# Patient Record
Sex: Female | Born: 1935 | Race: White | Hispanic: No
Health system: Southern US, Community
[De-identification: ages and names within clinical notes are randomized; demographics above are authoritative.]

---

## 2017-07-14 ENCOUNTER — Emergency Department (HOSPITAL_COMMUNITY): Payer: Medicare Other

## 2017-07-14 ENCOUNTER — Emergency Department (HOSPITAL_COMMUNITY)
Admission: EM | Admit: 2017-07-14 | Discharge: 2017-07-14 | Disposition: A | Discharge status: Skilled Nursing Facility | Payer: Medicare Other | Attending: Emergency Medicine | Admitting: Emergency Medicine

## 2017-07-14 DIAGNOSIS — W010XXA Fall on same level from slipping, tripping and stumbling without subsequent striking against object, initial encounter: Secondary | ICD-10-CM | POA: Insufficient documentation

## 2017-07-14 DIAGNOSIS — W19XXXA Unspecified fall, initial encounter: Secondary | ICD-10-CM

## 2017-07-14 DIAGNOSIS — S82002A Unspecified fracture of left patella, initial encounter for closed fracture: Secondary | ICD-10-CM | POA: Diagnosis not present

## 2017-07-14 DIAGNOSIS — S8992XA Unspecified injury of left lower leg, initial encounter: Secondary | ICD-10-CM | POA: Diagnosis present

## 2017-07-14 DIAGNOSIS — Y9389 Activity, other specified: Secondary | ICD-10-CM | POA: Diagnosis not present

## 2017-07-14 DIAGNOSIS — Y999 Unspecified external cause status: Secondary | ICD-10-CM | POA: Diagnosis not present

## 2017-07-14 DIAGNOSIS — Y92128 Other place in nursing home as the place of occurrence of the external cause: Secondary | ICD-10-CM | POA: Insufficient documentation

## 2017-07-14 DIAGNOSIS — S0083XA Contusion of other part of head, initial encounter: Secondary | ICD-10-CM

## 2017-07-14 NOTE — ED Triage Notes (Signed)
Per EMS, pt had an unwitnessed fall at [facility] nursing facility. EMS reports left leg shortening and bruising to the left side of face. Pt complains of left knee pain. Per EMS pt is AO x4. Pt has a hx of a-fib.

## 2017-07-14 NOTE — Discharge Instructions (Signed)
Please read and follow all provided instructions.  Your diagnoses today include:  1. Fall, initial encounter   2. Closed nondisplaced fracture of left patella, unspecified fracture morphology, initial encounter   3. Contusion of face, initial encounter     Tests performed today include: Vital signs. See below for your results today.   Medications prescribed:  Take as prescribed   Home care instructions:  Follow any educational materials contained in this packet. You can put weight on your leg, but you cannot bend your knee.   Follow-up instructions: Please follow-up with your Orthopedic provider for further evaluation of symptoms and treatment when you are back home   Return instructions:  Please return to the Emergency Department if you do not get better, if you get worse, or new symptoms OR  - Fever (temperature greater than 101.17F)  - Bleeding that does not stop with holding pressure to the area    -Severe pain (please note that you may be more sore the day after your accident)  - Chest Pain  - Difficulty breathing  - Severe nausea or vomiting  - Inability to tolerate food and liquids  - Passing out  - Skin becoming red around your wounds  - Change in mental status (confusion or lethargy)  - New numbness or weakness    Please return if you have any other emergent concerns.  Additional Information:  Your vital signs today were: BP 132/74    Pulse (!) 109    Temp 98.4 F (36.9 C)    Resp 17    SpO2 96%  If your blood pressure (BP) was elevated above 135/85 this visit, please have this repeated by your doctor within one month. ---------------

## 2017-07-14 NOTE — ED Notes (Signed)
[facility] called for TRANSPORT.

## 2017-07-14 NOTE — ED Provider Notes (Signed)
[facility]-EMERGENCY DEPT Provider Note   CSN: [id number] Arrival date & time: 07/14/17  1831     History   Chief Complaint Chief Complaint  Patient presents with  . Fall    HPI Brooke Johnson is a 83 y.o. female.  HPI  83 y.o. female presents to the Emergency Department today via EMS from [facility] due to unwitnessed fall. Pt residing at facility temporarily and is from [place]. Here in [place] due to [place]. Pt states that she got up last night and had mechanical fall. Notes losing her balance. Noted head trauma on left side with bruising noted. Pt states she had no LOC. Denies headache. No visual changes. No N/V. No CP/SOB/ABD pain. No weakness. No numbness/tingling. Denies neck pain. EMS concerned due to shortening of left on left side. Pt denies hip pain. Only endorses left knee pain. Mild swelling. Rates 2/10. No meds PTA. PT does not take blood thinners. No other symptoms noted   No past medical history on file.  There are no active problems to display for this patient.   No past surgical history on file.  OB History    No data available       Home Medications    Prior to Admission medications   Not on File    Family History No family history on file.  Social History Social History  Substance Use Topics  . Smoking status: Not on file  . Smokeless tobacco: Not on file  . Alcohol use Not on file     Allergies   Patient has no allergy information on record.   Review of Systems Review of Systems ROS reviewed and all are negative for acute change except as noted in the HPI.  Physical Exam Updated Vital Signs BP (!) 153/130 (BP Location: Right Arm)   Pulse (!) 58   Temp 98.4 F (36.9 C)   Resp 16   SpO2 94%   Physical Exam  Constitutional: Vital signs are normal. She appears well-developed and well-nourished. No distress.  HENT:  Head: Normocephalic. Head is with contusion. Head is without raccoon's eyes and without Battle's  sign.  Right Ear: No hemotympanum.  Left Ear: No hemotympanum.  Nose: Nose normal.  Mouth/Throat: Uvula is midline, oropharynx is clear and moist and mucous membranes are normal.  Contusion noted on left max. TTP along superior orbit. No swelling. EOM intact and unremarkable. Noted previous surgery to left jaw.   Eyes: Pupils are equal, round, and reactive to light. EOM are normal.  Neck: Trachea normal and normal range of motion. Neck supple. No spinous process tenderness and no muscular tenderness present. No tracheal deviation and normal range of motion present.  No pain along C/T/L spine. No palpable or visible deformities.   Cardiovascular: Normal rate, regular rhythm, S1 normal, S2 normal, normal heart sounds, intact distal pulses and normal pulses.   Pulmonary/Chest: Effort normal and breath sounds normal. No respiratory distress. She has no decreased breath sounds. She has no wheezes. She has no rhonchi. She has no rales.  Abdominal: Normal appearance and bowel sounds are normal. There is no tenderness. There is no rigidity and no guarding.  Musculoskeletal: Normal range of motion.  Left knee ROM intact. Mild TTP. No swelling. Bilateral hips non TTP. No pain with inversion/eversion. No swelling. NVI  Neurological: She is alert. She has normal strength. No cranial nerve deficit or sensory deficit.  Skin: Skin is warm and dry.  Psychiatric: She has a normal mood and  affect. Her speech is normal and behavior is normal.  Nursing note and vitals reviewed.  ED Treatments / Results  Labs (all labs ordered are listed, but only abnormal results are displayed) Labs Reviewed - No data to display  EKG  EKG Interpretation None       Radiology Ct Head Wo Contrast  Result Date: 07/14/2017 CLINICAL DATA:  Fall with bruising to left eye and left side of face. EXAM: CT HEAD WITHOUT CONTRAST CT MAXILLOFACIAL WITHOUT CONTRAST TECHNIQUE: Multidetector CT imaging of the head and maxillofacial  structures were performed using the standard protocol without intravenous contrast. Multiplanar CT image reconstructions of the maxillofacial structures were also generated. COMPARISON:  None. FINDINGS: CT HEAD FINDINGS Brain: There is no evidence for acute hemorrhage, hydrocephalus, mass lesion, or abnormal extra-axial fluid collection. No definite CT evidence for acute infarction. Diffuse loss of parenchymal volume is consistent with atrophy. Patchy low attenuation in the deep hemispheric and periventricular white matter is nonspecific, but likely reflects chronic microvascular ischemic demyelination. Vascular: No hyperdense vessel or unexpected calcification. Skull: No evidence for fracture. No worrisome lytic or sclerotic lesion. Other: None. CT MAXILLOFACIAL FINDINGS Osseous: Bones are diffusely demineralized. No evidence for an acute fracture. Temporomandibular joints are degenerated but located. No inferior or medial orbital wall fracture. Extensive postsurgical change noted left maxillary sinus. Evidence of chronic sinusitis noted posterior right ethmoid air cells and right sphenoid sinus. Orbits: Unremarkable. Sinuses: As above. Soft tissues: Unremarkable. IMPRESSION: 1. No acute intracranial abnormality. 2. Atrophy with chronic small vessel white matter ischemic demyelination. 3. No evidence for an acute maxillofacial fracture. 4. Postsurgical changes in the left paranasal sinuses with evidence of chronic sinusitis in the right paranasal sinuses. Electronically Signed   By: Kennith Center M.D.   On: 07/14/2017 19:47   Dg Knee Complete 4 Views Left  Result Date: 07/14/2017 CLINICAL DATA:  Unwitnessed fall, left leg shortening and bruising to left side of face. EXAM: LEFT KNEE - COMPLETE 4+ VIEW COMPARISON:  None. FINDINGS: Five views of the left knee are provided. Osteopenia limits characterization of osseous detail but there is no fracture line or displaced fracture fragment seen within the proximal  tibia or fibula or within the distal femur. Intramedullary rod within the distal femur is partially imaged, appearing intact and appropriately positioned. There is a probable nondisplaced fracture at the superior margin of the patella, with faint lucent line only visible on the lateral projection. Probable joint effusion within the adjacent suprapatellar bursa. Prominent degenerative narrowing of the medial compartment with associated osseous spurring. IMPRESSION: 1. Suspect nondisplaced fracture within the upper pole of the patella. No evidence of additional fracture or dislocation seen. 2. Joint effusion. 3. Degenerative narrowing of the medial compartment, suggesting underlying cartilage loss or internal derangement, with associated osseous spurring. 4. Osteopenia. Electronically Signed   By: Bary Richard M.D.   On: 07/14/2017 19:40   Ct Maxillofacial Wo Contrast  Result Date: 07/14/2017 CLINICAL DATA:  Fall with bruising to left eye and left side of face. EXAM: CT HEAD WITHOUT CONTRAST CT MAXILLOFACIAL WITHOUT CONTRAST TECHNIQUE: Multidetector CT imaging of the head and maxillofacial structures were performed using the standard protocol without intravenous contrast. Multiplanar CT image reconstructions of the maxillofacial structures were also generated. COMPARISON:  None. FINDINGS: CT HEAD FINDINGS Brain: There is no evidence for acute hemorrhage, hydrocephalus, mass lesion, or abnormal extra-axial fluid collection. No definite CT evidence for acute infarction. Diffuse loss of parenchymal volume is consistent with atrophy. Patchy low attenuation in  the deep hemispheric and periventricular white matter is nonspecific, but likely reflects chronic microvascular ischemic demyelination. Vascular: No hyperdense vessel or unexpected calcification. Skull: No evidence for fracture. No worrisome lytic or sclerotic lesion. Other: None. CT MAXILLOFACIAL FINDINGS Osseous: Bones are diffusely demineralized. No evidence  for an acute fracture. Temporomandibular joints are degenerated but located. No inferior or medial orbital wall fracture. Extensive postsurgical change noted left maxillary sinus. Evidence of chronic sinusitis noted posterior right ethmoid air cells and right sphenoid sinus. Orbits: Unremarkable. Sinuses: As above. Soft tissues: Unremarkable. IMPRESSION: 1. No acute intracranial abnormality. 2. Atrophy with chronic small vessel white matter ischemic demyelination. 3. No evidence for an acute maxillofacial fracture. 4. Postsurgical changes in the left paranasal sinuses with evidence of chronic sinusitis in the right paranasal sinuses. Electronically Signed   By: Kennith Center M.D.   On: 07/14/2017 19:47    Procedures Procedures (including critical care time)  Medications Ordered in ED Medications - No data to display   Initial Impression / Assessment and Plan / ED Course  I have reviewed the triage vital signs and the nursing notes.  Pertinent labs & imaging results that were available during my care of the patient were reviewed by me and considered in my medical decision making (see chart for details).  Final Clinical Impressions(s) / ED Diagnoses   {I have reviewed and evaluated the relevant imaging studies.  {I have reviewed the relevant previous healthcare records.  {I obtained HPI from historian. {Patient discussed with supervising physician.  ED Course:  Assessment: Pt is a 83 y.o. female presents to the Emergency Department today via EMS from [facility] due to unwitnessed fall. Pt residing at facility temporarily and is from [place]. Here in [place] due to [place]. Pt states that she got up last night and had mechanical fall. Notes losing her balance. Noted head trauma on left side with bruising noted. Pt states she had no LOC. Denies headache. No visual changes. No N/V. No CP/SOB/ABD pain. No weakness. No numbness/tingling. Denies neck pain. EMS concerned due to  shortening of left on left side. Pt denies hip pain. Only endorses left knee pain. Mild swelling. Rates 2/10. No meds PTA. PT does not take blood thinners. CT Head/Max unremarkable. DG Knee shows patellar fracture. Given knee immobilizer. No pain with ROM hips. Seen by supervising physician. Plan is to DC home with follow up to PCP. At time of discharge, Patient is in no acute distress. Vital Signs are stable. Patient is able to ambulate. Patient able to tolerate PO.    Disposition/Plan:  DC Home Additional Verbal discharge instructions given and discussed with patient.  Pt Instructed to f/u with PCP in the next week for evaluation and treatment of symptoms. Return precautions given Pt acknowledges and agrees with plan  Supervising Physician Gwyneth Sprout, MD  Final diagnoses:  Fall, initial encounter  Closed nondisplaced fracture of left patella, unspecified fracture morphology, initial encounter  Contusion of face, initial encounter    New Prescriptions New Prescriptions   No medications on file     Audry Pili, Cordelia Poche 07/14/17 2046    Gwyneth Sprout, MD 07/20/17 2055

## 2017-10-28 DEATH — deceased

## 2018-03-23 IMAGING — CR DG KNEE COMPLETE 4+V*L*
5 series · 5 of 5 positions shown · non-contrast
Comparison: None.

CLINICAL DATA: Unwitnessed fall, left leg shortening and bruising
to left side of face.

EXAM:
LEFT KNEE - COMPLETE 4+ VIEW

[x knee ap left (1 of 3)]
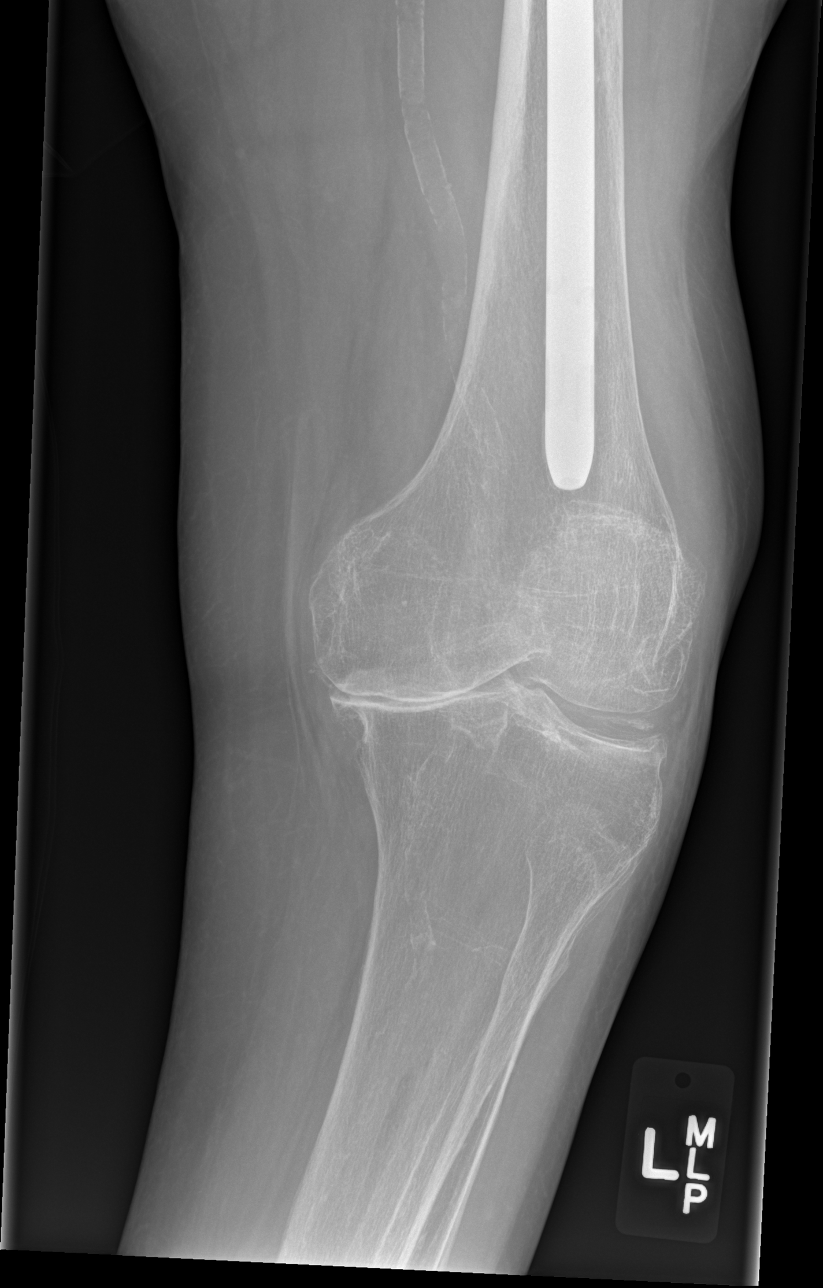

[x knee ap left (2 of 3)]
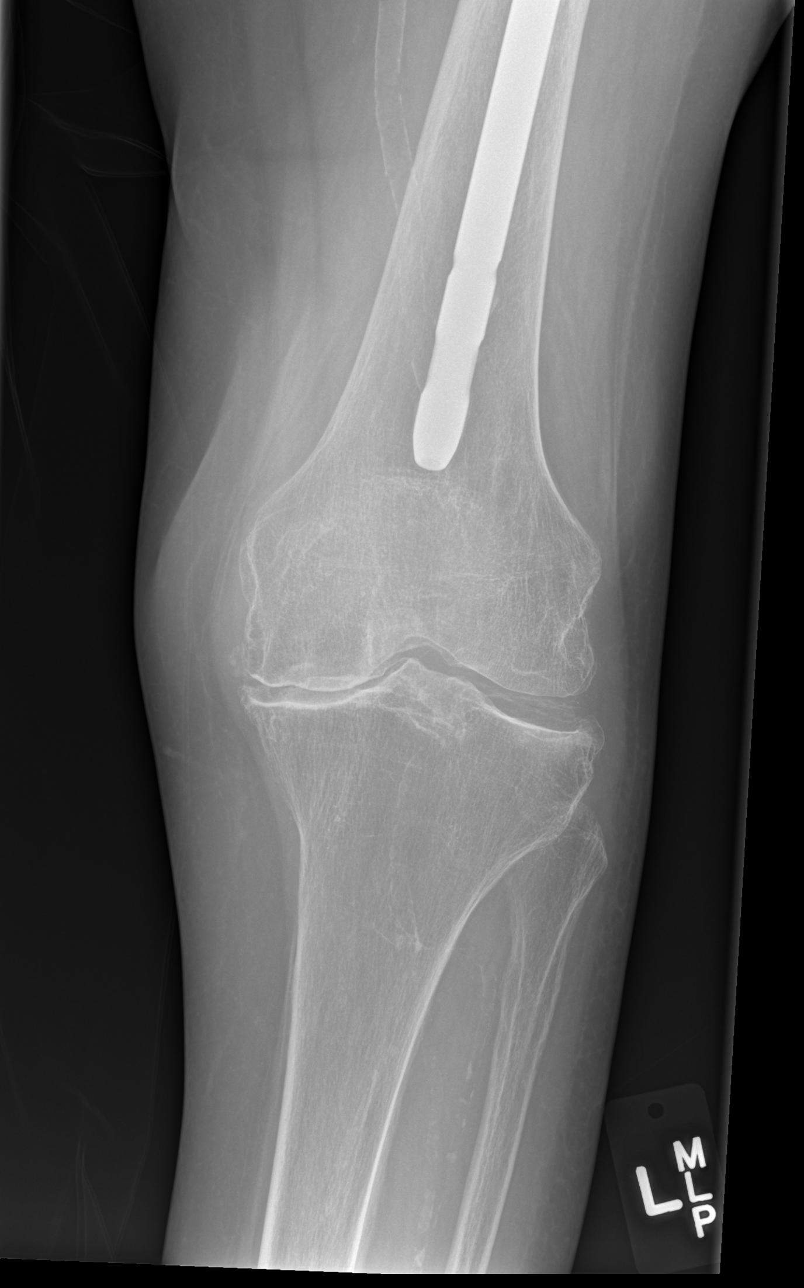

[x knee ap left (3 of 3)]
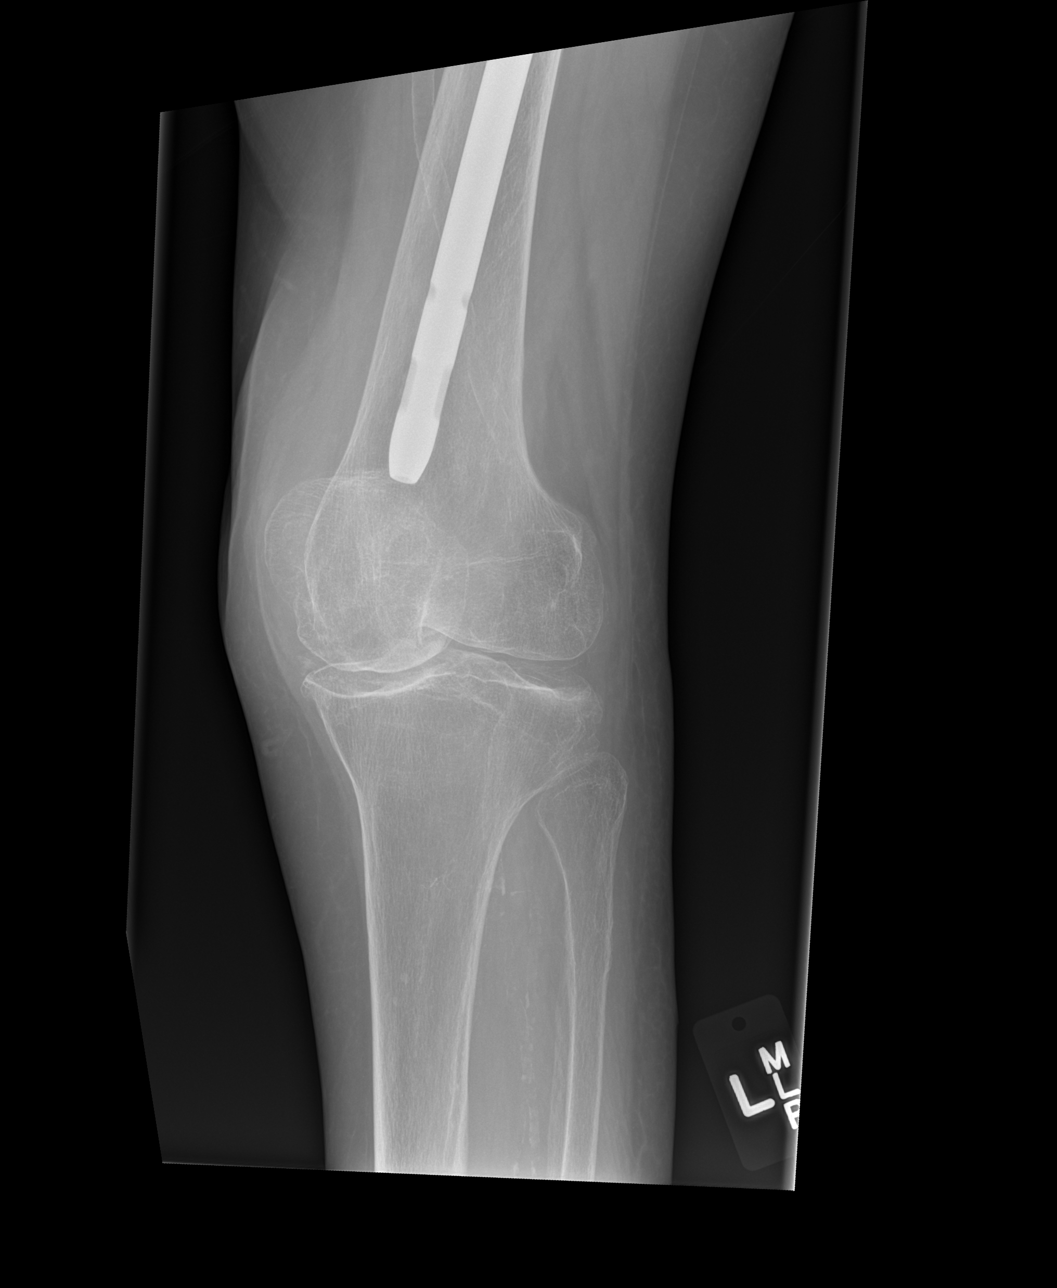

[x knee lat left (1 of 2)]
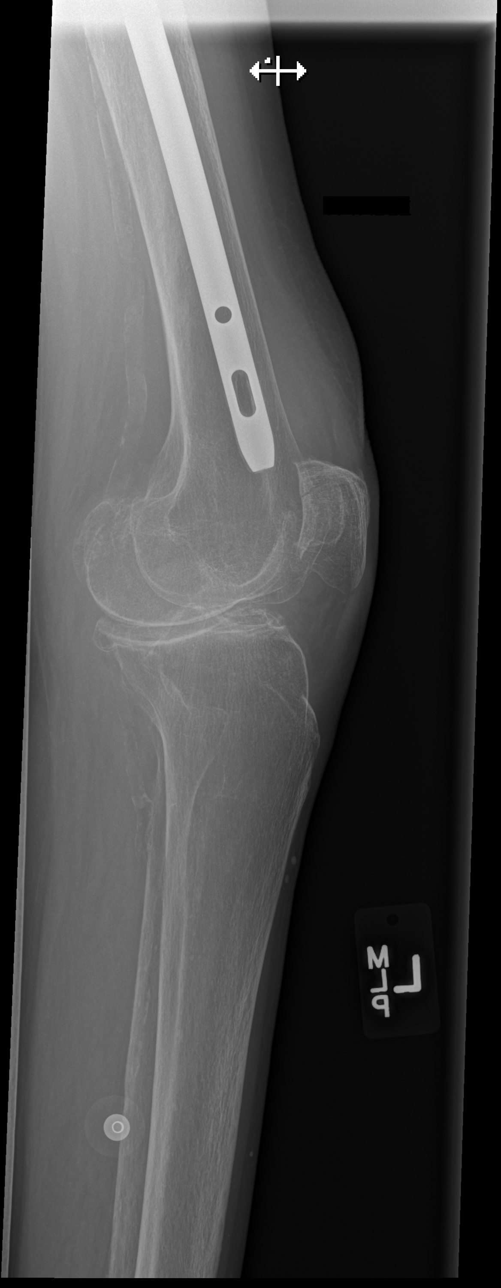

[x knee lat left (2 of 2)]
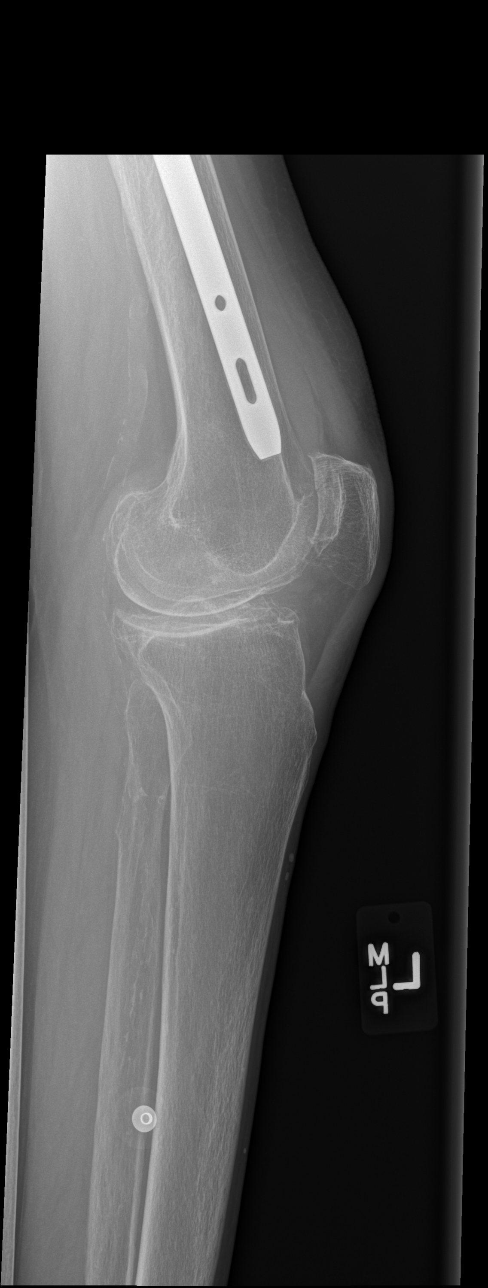

[5 of 5 positions shown; findings below may reference images not displayed]

FINDINGS: Five views of the left knee are provided.

Osteopenia limits characterization of osseous detail but there is no
fracture line or displaced fracture fragment seen within the
proximal tibia or fibula or within the distal femur. Intramedullary
rod within the distal femur is partially imaged, appearing intact
and appropriately positioned.

There is a probable nondisplaced fracture at the superior margin of
the patella, with faint lucent line only visible on the lateral
projection.

Probable joint effusion within the adjacent suprapatellar bursa.

Prominent degenerative narrowing of the medial compartment with
associated osseous spurring.
IMPRESSION: 1. Suspect nondisplaced fracture within the upper pole of the
patella. No evidence of additional fracture or dislocation seen.
2. Joint effusion.
3. Degenerative narrowing of the medial compartment, suggesting
underlying cartilage loss or internal derangement, with associated
osseous spurring.
4. Osteopenia.
